# Patient Record
Sex: Female | Born: 1990 | Race: White | Hispanic: No | Marital: Single | Smoking: Never smoker
Health system: Southern US, Community
[De-identification: ages and names within clinical notes are randomized; demographics above are authoritative.]

## PROBLEM LIST (undated history)

## (undated) DIAGNOSIS — F909 Attention-deficit hyperactivity disorder, unspecified type: Secondary | ICD-10-CM

## (undated) DIAGNOSIS — T7840XA Allergy, unspecified, initial encounter: Secondary | ICD-10-CM

## (undated) HISTORY — DX: Attention-deficit hyperactivity disorder, unspecified type: F90.9

## (undated) HISTORY — DX: Allergy, unspecified, initial encounter: T78.40XA

---
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
## 2011-01-08 ENCOUNTER — Other Ambulatory Visit: Payer: Self-pay | Admitting: *Deleted

## 2011-01-08 MED ORDER — AMPHETAMINE-DEXTROAMPHET ER 30 MG PO CP24: 30.0000 mg | ORAL_CAPSULE | ORAL | Status: DC

## 2011-01-08 NOTE — Telephone Encounter (Signed)
Please call mother when ready.

## 2011-01-08 NOTE — Telephone Encounter (Signed)
Rx in your IN box for signature. 

## 2011-01-09 NOTE — Telephone Encounter (Signed)
Left message on mom's cell phone voicemail advising that Rx is ready for pick up will be left at front desk.

## 2011-02-21 ENCOUNTER — Other Ambulatory Visit: Payer: Self-pay | Admitting: Internal Medicine

## 2011-02-21 MED ORDER — AMPHETAMINE-DEXTROAMPHET ER 30 MG PO CP24: 30.0000 mg | ORAL_CAPSULE | ORAL | Status: DC

## 2011-02-21 NOTE — Telephone Encounter (Signed)
It should be in her chart.  Please enter formal refill request.

## 2011-02-21 NOTE — Telephone Encounter (Signed)
Refill on her Adderall XR 30mg .  It's not listed in her chart.  Please advise.

## 2011-02-21 NOTE — Telephone Encounter (Signed)
Left message on mom's cell phone voicemail, Rx is ready for pick up will be left at front desk.

## 2011-04-09 ENCOUNTER — Telehealth: Payer: Self-pay | Admitting: *Deleted

## 2011-04-09 MED ORDER — AMPHETAMINE-DEXTROAMPHET ER 30 MG PO CP24: 30.0000 mg | ORAL_CAPSULE | ORAL | Status: DC

## 2011-04-09 NOTE — Telephone Encounter (Signed)
Rx given to patients mom Corrie Dandy) during office visit today.

## 2011-05-28 ENCOUNTER — Other Ambulatory Visit: Payer: Self-pay

## 2011-05-28 MED ORDER — AMPHETAMINE-DEXTROAMPHET ER 30 MG PO CP24: 30.0000 mg | ORAL_CAPSULE | ORAL | Status: DC

## 2011-05-28 NOTE — Telephone Encounter (Signed)
Ok to print and put in my box to sign. 

## 2011-05-28 NOTE — Telephone Encounter (Signed)
Lucilla Lame request written rx for pts Adderall XR 30 mg. Pt last seen 10/15/09. Please call Lucilla Lame when rx ready for pick up at [phone].

## 2011-05-29 NOTE — Telephone Encounter (Signed)
Script placed up front for pick up, advised mother.

## 2011-07-10 ENCOUNTER — Other Ambulatory Visit: Payer: Self-pay

## 2011-07-10 NOTE — Telephone Encounter (Signed)
pts mother Avonelle Viveros left v/m requesting written rx for Adderall XR 30 mg. Pt last seen 10/15/09. When rx ready for pick up call [phone].

## 2011-07-11 MED ORDER — AMPHETAMINE-DEXTROAMPHET ER 30 MG PO CP24: 30.0000 mg | ORAL_CAPSULE | ORAL | Status: DC

## 2011-07-11 NOTE — Telephone Encounter (Signed)
Left message on pt's voice mail advising her scripts are ready for pick up, script placed up front.

## 2011-08-21 ENCOUNTER — Ambulatory Visit (INDEPENDENT_AMBULATORY_CARE_PROVIDER_SITE_OTHER): Payer: BC Managed Care – PPO | Admitting: Family Medicine

## 2011-08-21 ENCOUNTER — Encounter: Payer: Self-pay | Admitting: Family Medicine

## 2011-08-21 ENCOUNTER — Ambulatory Visit: Payer: Self-pay | Admitting: Family Medicine

## 2011-08-21 ENCOUNTER — Ambulatory Visit (INDEPENDENT_AMBULATORY_CARE_PROVIDER_SITE_OTHER)
Admission: RE | Admit: 2011-08-21 | Discharge: 2011-08-21 | Disposition: A | Discharge status: Home / Self Care | Payer: BC Managed Care – PPO | Source: Ambulatory Visit | Attending: Family Medicine | Admitting: Family Medicine

## 2011-08-21 VITALS — BP 110/60 | HR 78 | Temp 97.7°F | Ht 65.5 in | Wt 168.8 lb

## 2011-08-21 DIAGNOSIS — S93429A Sprain of deltoid ligament of unspecified ankle, initial encounter: Secondary | ICD-10-CM

## 2011-08-21 DIAGNOSIS — M79673 Pain in unspecified foot: Secondary | ICD-10-CM

## 2011-08-21 DIAGNOSIS — M79609 Pain in unspecified limb: Secondary | ICD-10-CM

## 2011-08-21 DIAGNOSIS — M25579 Pain in unspecified ankle and joints of unspecified foot: Secondary | ICD-10-CM

## 2011-08-21 LAB — POCT URINE PREGNANCY: Preg Test, Ur: NEGATIVE

## 2011-08-22 NOTE — Progress Notes (Signed)
Nature conservation officer at [facility] [address] [place] [address] Phone: [phone] Fax: [phone]   Patient Name: Norma Taylor Date of Birth: 1991/01/08 Age: 21 y.o. Medical Record Number: [id number] Gender: female Date of Encounter: 08/21/2011  Chief Complaint: Ankle Injury   History of Present Illness:  Norma Taylor is a 21 y.o. very pleasant female patient who presents with the following:  Very pleasant patient who presents for evaluation of her RIGHT foot and ankle after injuring it several days ago. One of her friends was having a seizure, and in some way she injured her foot and ankle. She is not clear if she turned it. She is not clear if she struck it directly. She is having the most pain and her calcaneus. She is also having some mild medial pain. No lateral plane. No pain in the region of the calcaneofibular ligament or the ATFL. No pain along the tibia or fibula. She is having affectively no swelling at this point. No bruising. She has been able to walk throughout the time, and has been able to walk since the time of injury.  Past Medical History, Surgical History, Social History, Family History, Problem List, Medications, and Allergies have been reviewed and updated if relevant.  Current Outpatient Prescriptions on File Prior to Visit  Medication Sig Dispense Refill  . amphetamine-dextroamphetamine (ADDERALL XR) 30 MG 24 hr capsule Take 1 capsule (30 mg total) by mouth every morning.  30 capsule  0  . amphetamine-dextroamphetamine (ADDERALL XR, 30MG ,) 30 MG 24 hr capsule Take 1 capsule (30 mg total) by mouth every morning.  30 capsule  0  . amphetamine-dextroamphetamine (ADDERALL XR, 30MG ,) 30 MG 24 hr capsule Take 1 capsule (30 mg total) by mouth every morning.  30 capsule  0  . amphetamine-dextroamphetamine (ADDERALL XR, 30MG ,) 30 MG 24 hr capsule Take 1 capsule (30 mg total) by mouth every morning.  30 capsule  0  . amphetamine-dextroamphetamine  (ADDERALL XR, 30MG ,) 30 MG 24 hr capsule Take 1 capsule (30 mg total) by mouth every morning.  30 capsule  0  . amphetamine-dextroamphetamine (ADDERALL XR, 30MG ,) 30 MG 24 hr capsule Take 1 capsule (30 mg total) by mouth every morning.  30 capsule  0    Review of Systems:  GEN: No fevers, chills. Nontoxic. Primarily MSK c/o today. MSK: Detailed in the HPI GI: tolerating PO intake without difficulty Neuro: No numbness, parasthesias, or tingling associated. Otherwise the pertinent positives of the ROS are noted above.    Physical Examination: Filed Vitals:   08/21/11 1008  BP: 110/60  Pulse: 78  Temp: 97.7 F (36.5 C)   Filed Vitals:   08/21/11 1008  Height: 5' 5.5" (1.664 m)  Weight: 168 lb 12 oz (76.544 kg)   Body mass index is 27.65 kg/(m^2). Ideal Body Weight: Weight in (lb) to have BMI = 25: 152.2    GEN: Well-developed,well-nourished,in no acute distress; alert,appropriate and cooperative throughout examination HEENT: Normocephalic and atraumatic without obvious abnormalities. Ears, externally no deformities PULM: Breathing comfortably in no respiratory distress EXT: No clubbing, cyanosis, or edema PSYCH: Normally interactive. Cooperative during the interview. Pleasant. Friendly and conversant. Not anxious or depressed appearing. Normal, full affect.  ANKLE: R Echymosis: no Edema: no ROM: Full dorsi and plantar flexion, inversion, eversion Gait: heel toe, non-antalgic Lateral Mall: NT Medial Mall: NT Talus: NT Navicular: NT Cuboid: NT Calcaneous: mildly tender with squeeze Metatarsals: NT 5th MT: NT Phalanges: NT Achilles: NT Plantar Fascia: NT Fat Pad:  NT Peroneals: NT Post Tib: NT Great Toe: Nml motion Ant Drawer: neg Talar Tilt: neg ATFL: NT CFL: NT Deltoid: mildly tender Str: 5/5 Other Special tests: kleiger neg, squeeze neg Sensation: intact   EKG / Xrays / Labs: Dg Ankle Complete Right  08/21/2011  *RADIOLOGY REPORT*  Clinical Data: Trauma,  ankle pain.  RIGHT ANKLE - COMPLETE 3+ VIEW  Comparison: None.  Findings: There is abnormal appearance of the ankle mortise with widening of the lateral mortise on both the AP and oblique views. Lateral soft tissue swelling.  I see no definite fracture.  IMPRESSION: Lateral ankle mortise widening without fracture.  Findings suggestive of ligamentous injury and instability.  Original Report Authenticated By: Cyndie Chime, M.D.  --- on my interpretation, there is no occult fracture. The mortise view is overrotated, so a true mortise view is not seen. The patient is not having any lateral ankle pain and is stable on her subtalar tilt test. Kleiger test and squeeze testing is all negative.  Dg Os Calcis Right  08/21/2011  *RADIOLOGY REPORT*  Clinical Data: Ankle pain.  Foot pain.  Trauma.  RIGHT OS CALCIS - 2+ VIEW  Comparison: None.  Findings: Anatomic alignment.  Calcaneus appears intact.  Subtalar joints appear congruent.  No fracture is identified.  IMPRESSION: Negative radiographs of the calcaneus.  Original Report Authenticated By: Andreas Newport, M.D.    Assessment and Plan: 1. Ankle pain  DG Ankle Complete Right, DG Os Calcis Right, POCT urine pregnancy  2. Foot pain  DG Ankle Complete Right, DG Os Calcis Right, POCT urine pregnancy  3. Deltoid (ligament), ankle sprain      Results for orders placed in visit on 08/21/11  POCT URINE PREGNANCY      Component Value Range   Preg Test, Ur Negative     Clinically consistent with mild deltoid ligament sprain on the RIGHT. Also suspected bone contusion is playing  The greatest role with her heel pain. Her strength is preserved.  I'm going to place her in his ASO ankle brace for the next week, and then titrate out of this. Reassured her and patient and her mother.  Over-the-counter Motrin or Tylenol for pain  Norma Beat, MD

## 2011-09-10 ENCOUNTER — Other Ambulatory Visit: Payer: Self-pay

## 2011-09-10 MED ORDER — AMPHETAMINE-DEXTROAMPHET ER 30 MG PO CP24: 30.0000 mg | ORAL_CAPSULE | ORAL | Status: DC

## 2011-09-10 NOTE — Telephone Encounter (Signed)
pts mother request rx adderall XR. Call when ready for pick up.

## 2011-09-10 NOTE — Telephone Encounter (Signed)
Advised mother script is ready for pick up, script placed up front.

## 2011-10-15 ENCOUNTER — Other Ambulatory Visit: Payer: Self-pay | Admitting: Family Medicine

## 2011-10-15 MED ORDER — AMPHETAMINE-DEXTROAMPHET ER 30 MG PO CP24: 30.0000 mg | ORAL_CAPSULE | ORAL | Status: DC

## 2011-10-15 NOTE — Telephone Encounter (Signed)
Advised pt Dr. Dayton Martes is out of the office until Monday. She is ok to wait till then for the script.  Script printed and placed in Dr. Elmer Sow in box.

## 2011-10-15 NOTE — Telephone Encounter (Signed)
Needs a script for Adderall XR 30mg  po daily.   Please let mom know when this is ready to be picked up.

## 2011-10-20 NOTE — Telephone Encounter (Signed)
Advised patient's mother script is ready for pick up, script placed at front desk.

## 2011-11-24 ENCOUNTER — Telehealth: Payer: Self-pay | Admitting: Family Medicine

## 2011-11-24 NOTE — Telephone Encounter (Signed)
The pt's mother called the triage line hoping to get a refill of Adderall for the patient.  She stated she is hoping to change the dose to 20mg .  Callback - [phone]

## 2011-11-24 NOTE — Telephone Encounter (Signed)
Pt has been taking 30 mg's but doesn't need think that she needs that high of a dose anymore, she isn't taking as many classes now.  Wants to decrease to 20 mg's.  Please advise.

## 2011-11-25 MED ORDER — AMPHETAMINE-DEXTROAMPHET ER 20 MG PO CP24: 20.0000 mg | ORAL_CAPSULE | ORAL | Status: DC

## 2011-11-25 NOTE — Telephone Encounter (Signed)
Rx signed, on my desk.

## 2011-11-25 NOTE — Telephone Encounter (Signed)
Advised mother script is ready for pick up, script placed at front desk.

## 2011-11-25 NOTE — Telephone Encounter (Signed)
Script printed, on your desk for signature.

## 2011-11-25 NOTE — Telephone Encounter (Signed)
Ok to decrease to 20 mg.

## 2011-12-22 ENCOUNTER — Other Ambulatory Visit: Payer: Self-pay

## 2011-12-22 NOTE — Telephone Encounter (Signed)
pts mother request rx for Adderall XR 20 mg. Corrie Dandy said pt only taking XR 20 mg now.call when ready for pick up.

## 2011-12-23 MED ORDER — AMPHETAMINE-DEXTROAMPHET ER 20 MG PO CP24: 20.0000 mg | ORAL_CAPSULE | ORAL | Status: DC

## 2011-12-23 NOTE — Telephone Encounter (Signed)
Left message advising pt's mother script is ready for pick up.

## 2012-03-01 ENCOUNTER — Other Ambulatory Visit: Payer: Self-pay

## 2012-03-01 MED ORDER — AMPHETAMINE-DEXTROAMPHET ER 20 MG PO CP24: 20.0000 mg | ORAL_CAPSULE | ORAL | Status: DC

## 2012-03-01 NOTE — Telephone Encounter (Signed)
pts mother left v/m requesting rx adderall XR 20 mg.call when ready for pick up.

## 2012-03-01 NOTE — Telephone Encounter (Signed)
Left message on home voice mail that script is ready for pick up.

## 2012-03-04 ENCOUNTER — Telehealth: Payer: Self-pay

## 2012-03-04 NOTE — Telephone Encounter (Signed)
pts mother left v/m that insurance has changed and needs prior auth for adderall. Pharmacy is sending PA request but pts mother request to be done ASAP.Please advise.

## 2012-03-09 NOTE — Telephone Encounter (Signed)
pts mother said pt is out of med and wants status of PA for Adderall. Jacki Cones said has not received PA from pharmacy/ Pts mother will contact pharmacy to fax PA request.

## 2012-03-11 NOTE — Telephone Encounter (Signed)
Advised mother that we still haven't received prior auth form from pharmacy.  She said she will call pharmacy.

## 2012-03-11 NOTE — Telephone Encounter (Signed)
Caller: Mary/Mother; Phone: [phone]; Reason for Call: Mom calling today 03/11/12 regarding checking status of pre-authorization form that was supposed to be completed for her daughter's Adderall.  Said pharmacist, Kathlene November, from [facility] faxed the form for a second time on 03/09/12 however they still have not received this back from office.  PLEASE CALL MOM BACK AT [phone] TO ADVISE.  Thanks.

## 2012-03-15 ENCOUNTER — Telehealth: Payer: Self-pay | Admitting: *Deleted

## 2012-03-15 NOTE — Telephone Encounter (Signed)
Prior auth given for adderall over the phone, good for one year, advised patient's mother.

## 2012-03-16 NOTE — Telephone Encounter (Signed)
Prior auth given over the phone for adderall, pt's mother advised.  Approval letter placed on doctor's desk for signature and scanning.

## 2012-05-28 ENCOUNTER — Telehealth: Payer: Self-pay | Admitting: *Deleted

## 2012-05-28 MED ORDER — AMPHETAMINE-DEXTROAMPHET ER 30 MG PO CP24: 30.0000 mg | ORAL_CAPSULE | ORAL | Status: DC

## 2012-05-28 MED ORDER — AMPHETAMINE-DEXTROAMPHET ER 20 MG PO CP24: 20.0000 mg | ORAL_CAPSULE | ORAL | Status: DC

## 2012-05-28 NOTE — Telephone Encounter (Signed)
Mother is here seeing doctor and would like to pick up refills on adderall.

## 2012-05-28 NOTE — Telephone Encounter (Signed)
Rx refilled and given to mother.

## 2012-07-21 ENCOUNTER — Other Ambulatory Visit: Payer: Self-pay

## 2012-07-21 NOTE — Telephone Encounter (Signed)
Norma Taylor left v/m requesting rx adderall 20 mg. Call when ready for pick up.

## 2012-07-22 MED ORDER — AMPHETAMINE-DEXTROAMPHET ER 20 MG PO CP24: 20.0000 mg | ORAL_CAPSULE | ORAL | Status: DC

## 2012-07-22 NOTE — Telephone Encounter (Signed)
Left message on mother's voice mail advising script is ready for pick up, and because we have a new policy here in office, patient should pick up prescription herself.

## 2012-07-26 ENCOUNTER — Encounter: Payer: Self-pay | Admitting: Family Medicine

## 2012-08-09 ENCOUNTER — Encounter: Payer: Self-pay | Admitting: Family Medicine

## 2012-08-24 ENCOUNTER — Other Ambulatory Visit: Payer: Self-pay

## 2012-08-24 NOTE — Telephone Encounter (Signed)
Ok to print out and leave on my desk for signature. 

## 2012-08-24 NOTE — Telephone Encounter (Signed)
Norma Taylor left v/m requesting rx adderal XR 20 mg only. Call when ready for pick up.

## 2012-08-26 MED ORDER — AMPHETAMINE-DEXTROAMPHET ER 20 MG PO CP24: 20.0000 mg | ORAL_CAPSULE | ORAL | Status: DC

## 2012-09-09 MED ORDER — AMPHETAMINE-DEXTROAMPHET ER 20 MG PO CP24: 20.0000 mg | ORAL_CAPSULE | ORAL | Status: DC

## 2012-09-09 NOTE — Telephone Encounter (Signed)
This was done on 08/24/12.  Disregard script printed today.

## 2012-09-13 ENCOUNTER — Ambulatory Visit: Payer: BC Managed Care – PPO | Admitting: Family Medicine

## 2012-11-08 ENCOUNTER — Other Ambulatory Visit: Payer: Self-pay

## 2012-11-08 MED ORDER — AMPHETAMINE-DEXTROAMPHET ER 20 MG PO CP24: 20.0000 mg | ORAL_CAPSULE | ORAL | Status: DC

## 2012-11-08 NOTE — Telephone Encounter (Signed)
Norma Taylor pts mother request rx adderall xr. Call when ready for pick up.

## 2012-11-08 NOTE — Telephone Encounter (Signed)
Advised pt's mother scripts are ready for pick up, placed at front desk.

## 2012-12-20 ENCOUNTER — Other Ambulatory Visit: Payer: Self-pay

## 2012-12-20 NOTE — Telephone Encounter (Signed)
Norma Taylor request rx adderall. Call when ready for pick up.

## 2012-12-21 NOTE — Telephone Encounter (Signed)
Left message on machine to call back  

## 2012-12-21 NOTE — Telephone Encounter (Signed)
Patient's mom notified as instructed by telephone. Was advised that she will call back and schedule the appointment.

## 2012-12-21 NOTE — Telephone Encounter (Signed)
No refill denied.. Pt needs appt for ADHD or CPX if she has this here.

## 2012-12-21 NOTE — Telephone Encounter (Signed)
Last office visit 08/21/11. Is it okay to refill medication?

## 2013-03-17 ENCOUNTER — Ambulatory Visit: Payer: Self-pay

## 2013-03-17 LAB — RAPID INFLUENZA A&B ANTIGENS

## 2013-03-25 ENCOUNTER — Ambulatory Visit: Payer: BC Managed Care – PPO | Admitting: Family Medicine

## 2013-03-25 DIAGNOSIS — Z0289 Encounter for other administrative examinations: Secondary | ICD-10-CM

## 2013-05-05 ENCOUNTER — Ambulatory Visit: Payer: BC Managed Care – PPO | Admitting: Family Medicine

## 2013-06-23 ENCOUNTER — Ambulatory Visit: Payer: BC Managed Care – PPO | Admitting: Family Medicine

## 2013-07-04 ENCOUNTER — Ambulatory Visit: Payer: BC Managed Care – PPO | Admitting: Family Medicine

## 2013-07-15 IMAGING — CR DG OS CALCIS 2+V*R*
2 series · 2 of 2 positions shown · non-contrast
Comparison: None.

CLINICAL DATA: Ankle pain.  Foot pain.  Trauma.

RIGHT OS CALCIS - 2+ VIEW

[view not recorded (1 of 2)]
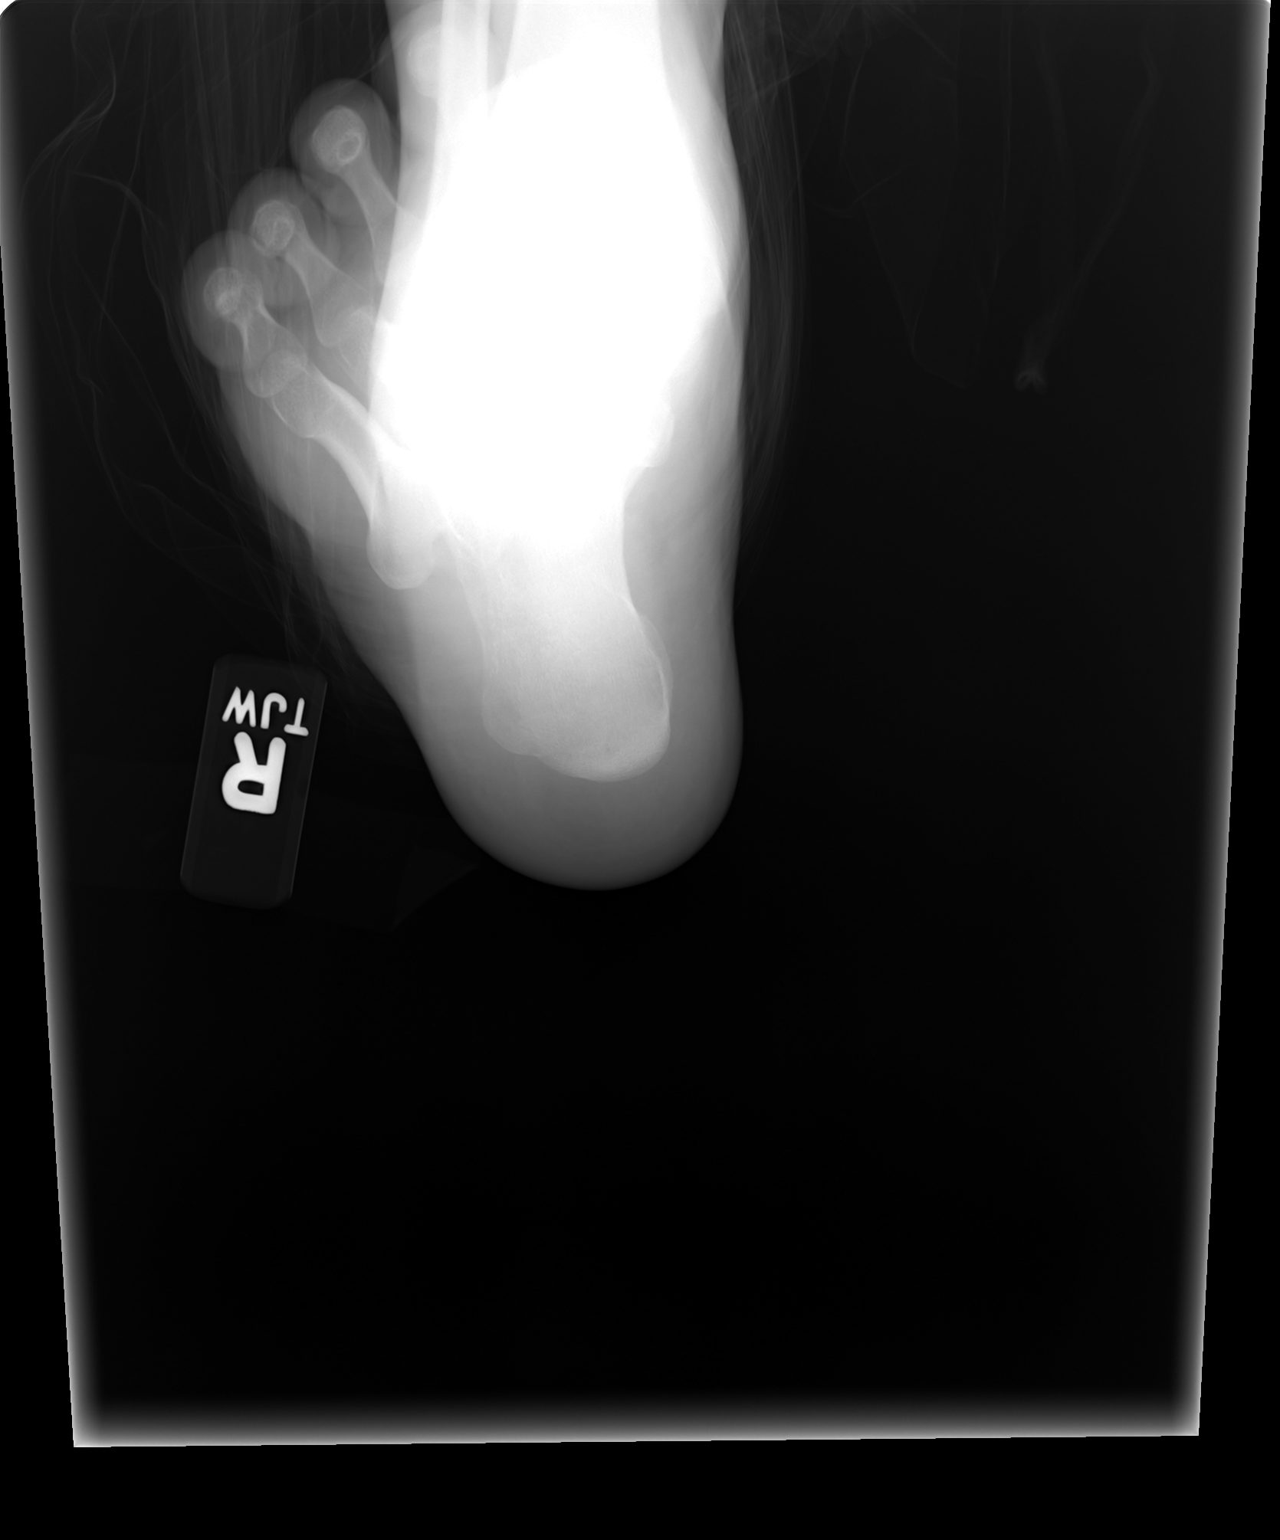

[view not recorded (2 of 2)]
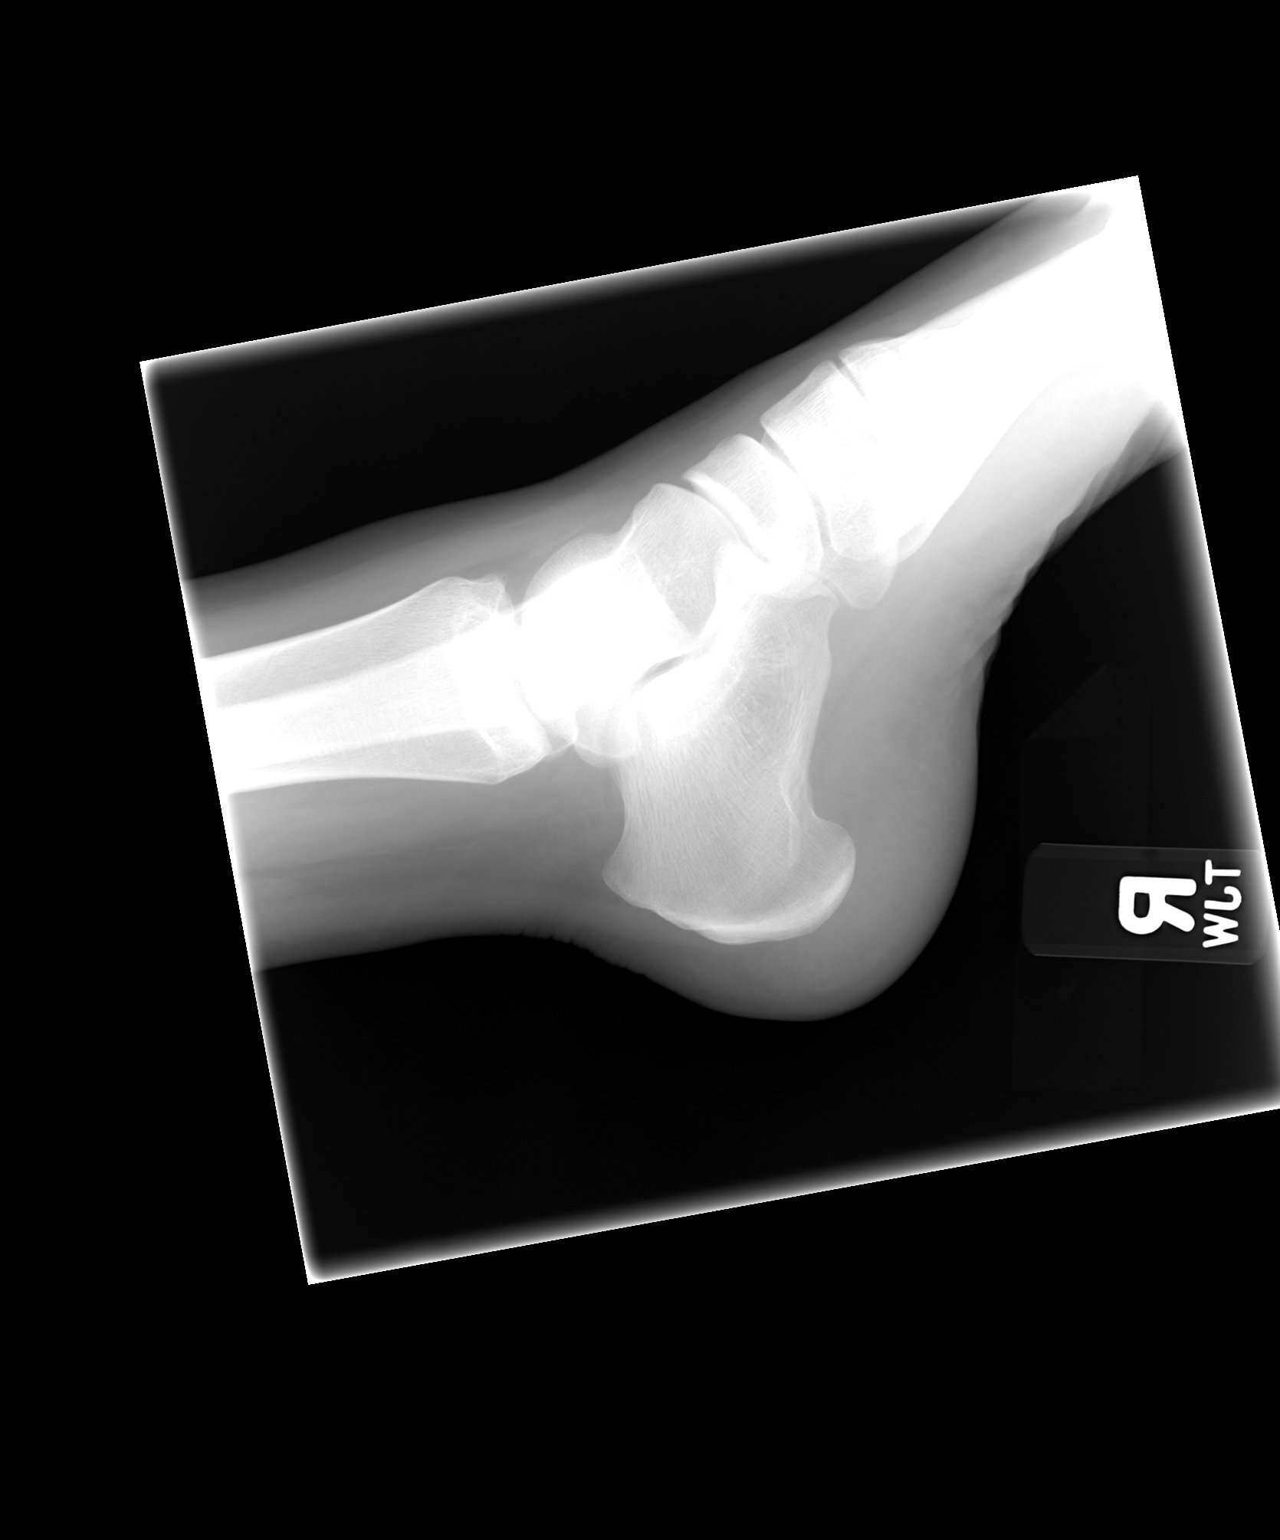

[2 of 2 positions shown; findings below may reference images not displayed]

FINDINGS: Anatomic alignment.  Calcaneus appears intact.  Subtalar
joints appear congruent.  No fracture is identified.
IMPRESSION: Negative radiographs of the calcaneus.

## 2013-07-15 IMAGING — CR DG ANKLE COMPLETE 3+V*R*
3 series · 3 of 3 positions shown · non-contrast
Comparison: None.

CLINICAL DATA: Trauma, ankle pain.

RIGHT ANKLE - COMPLETE 3+ VIEW

[view not recorded (1 of 3)]
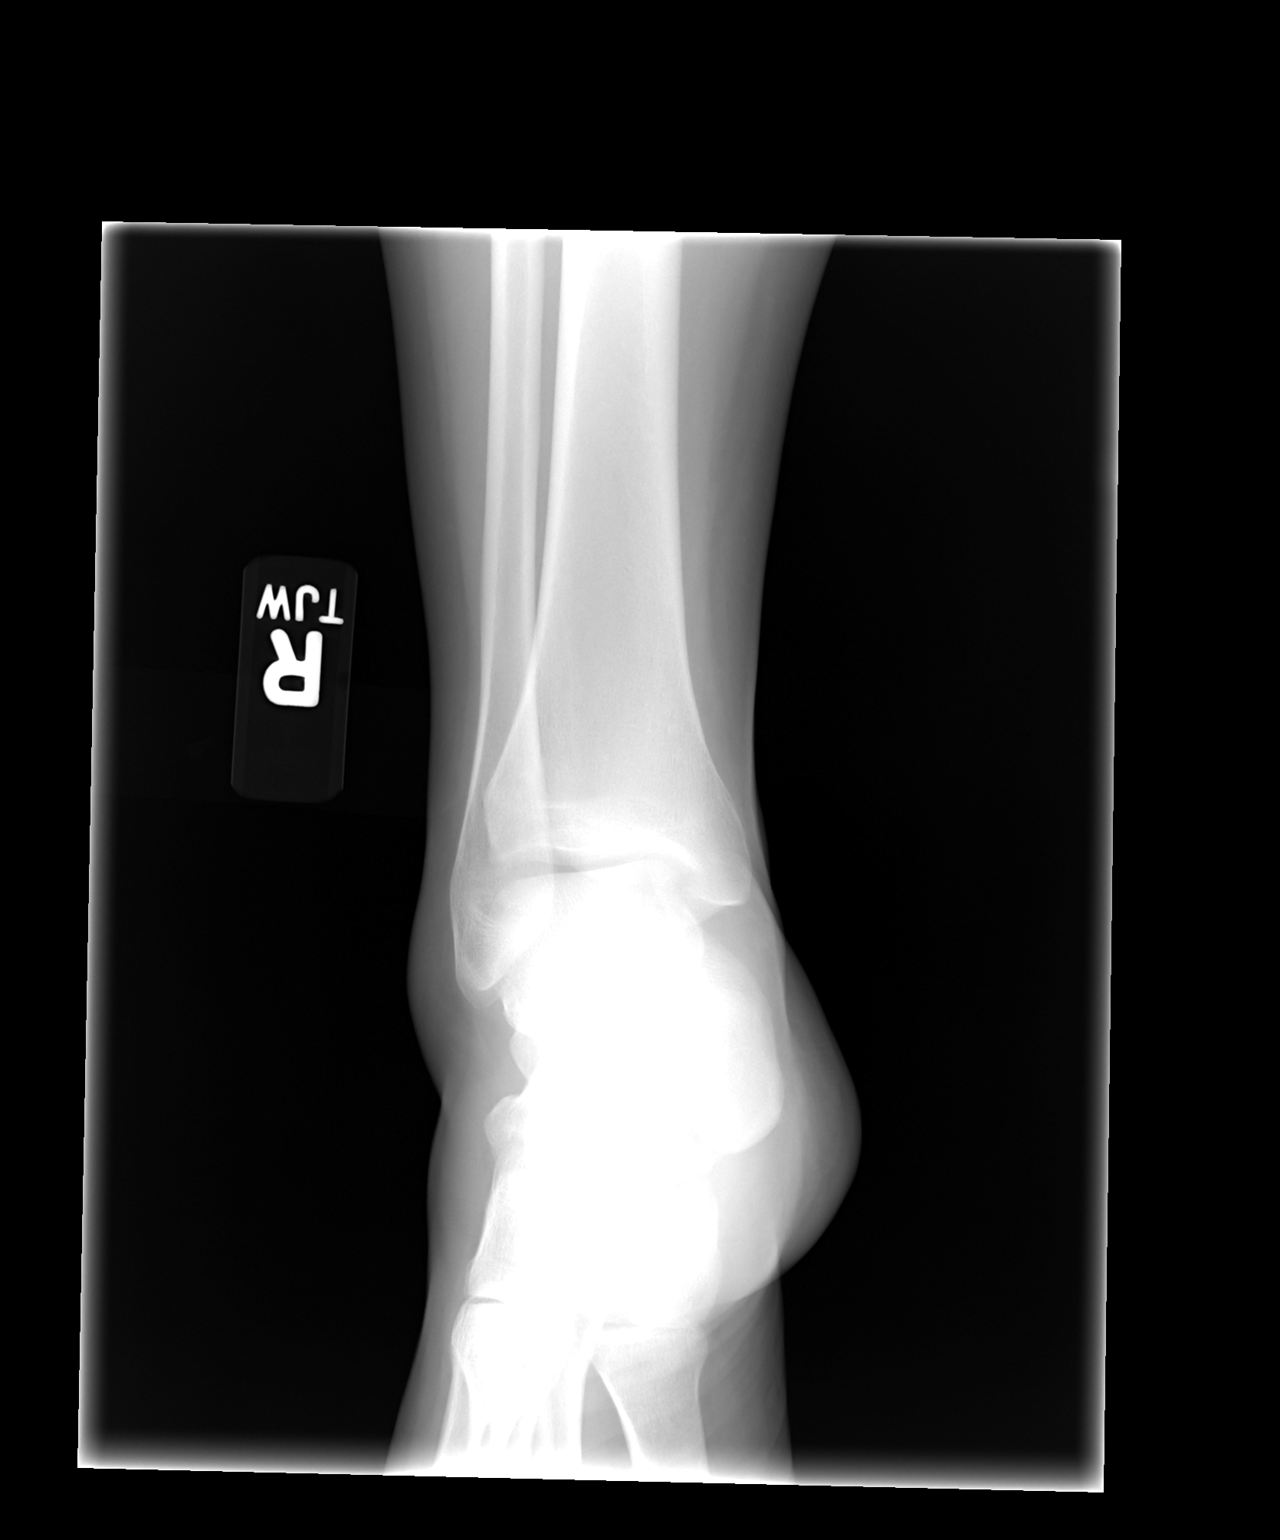

[view not recorded (2 of 3)]
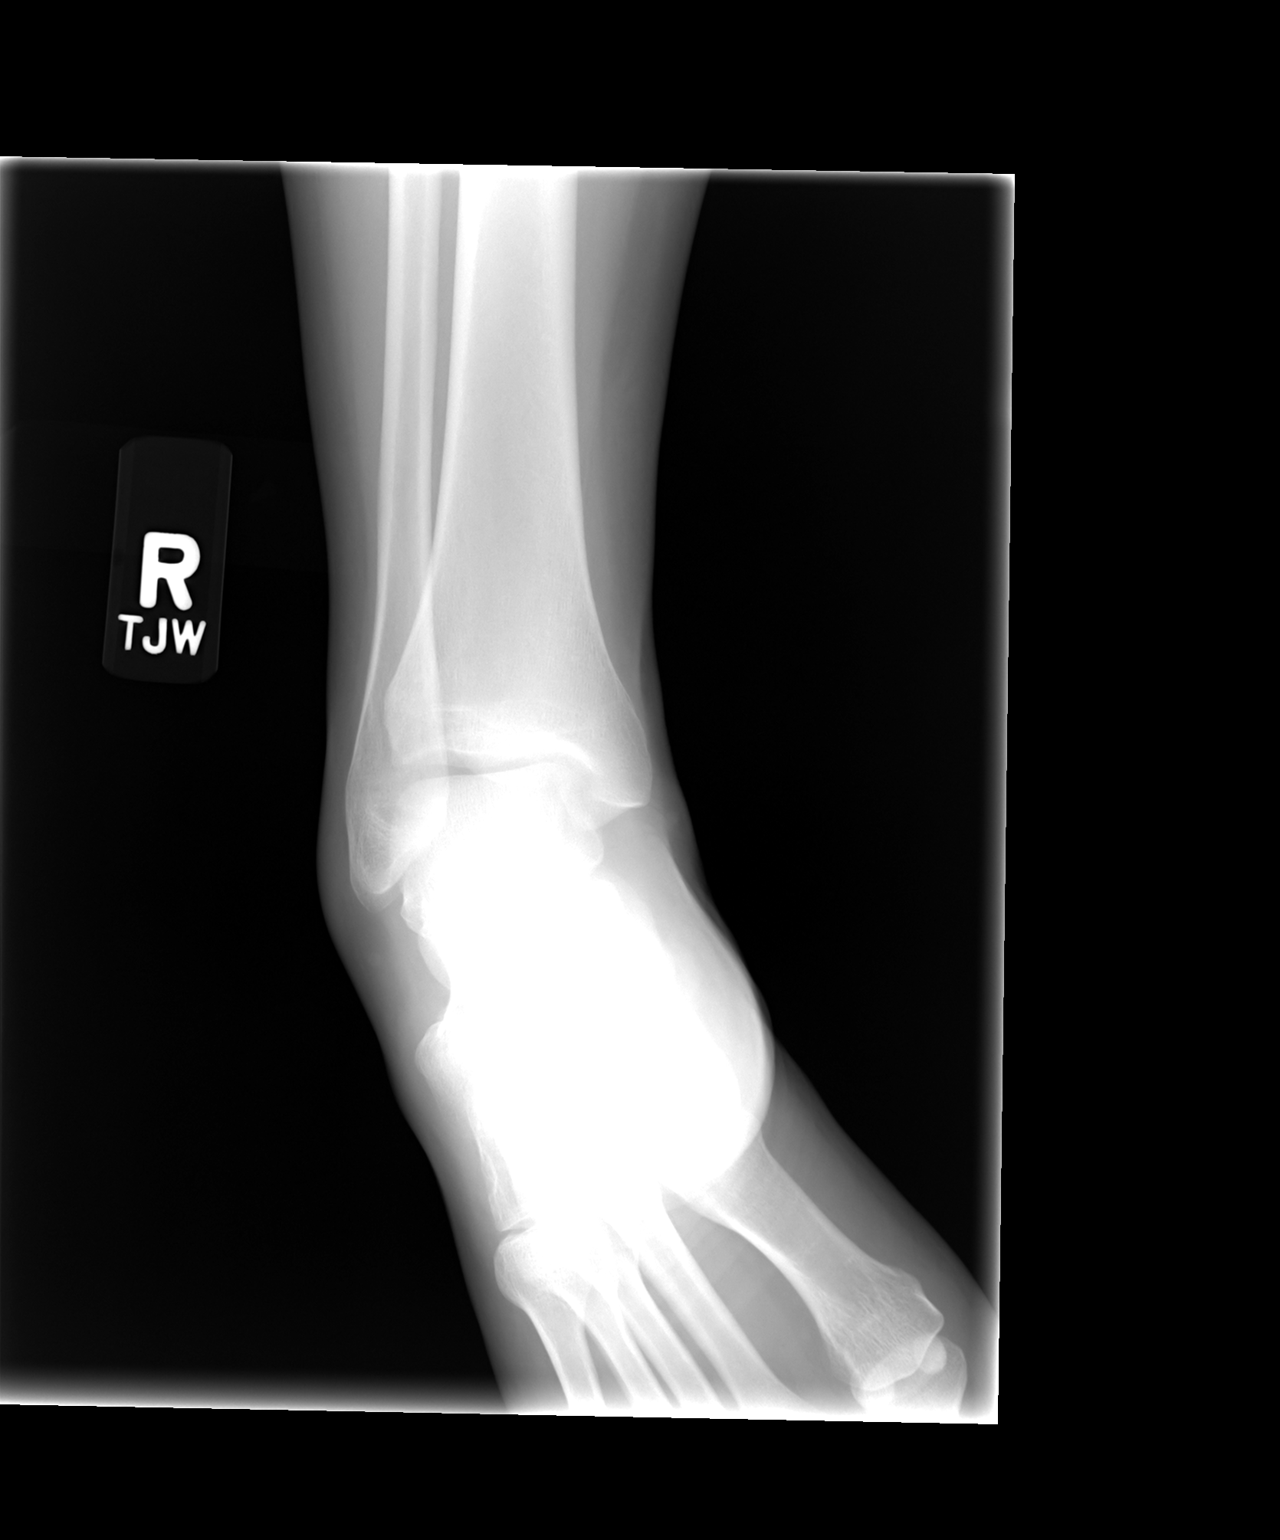

[view not recorded (3 of 3)]
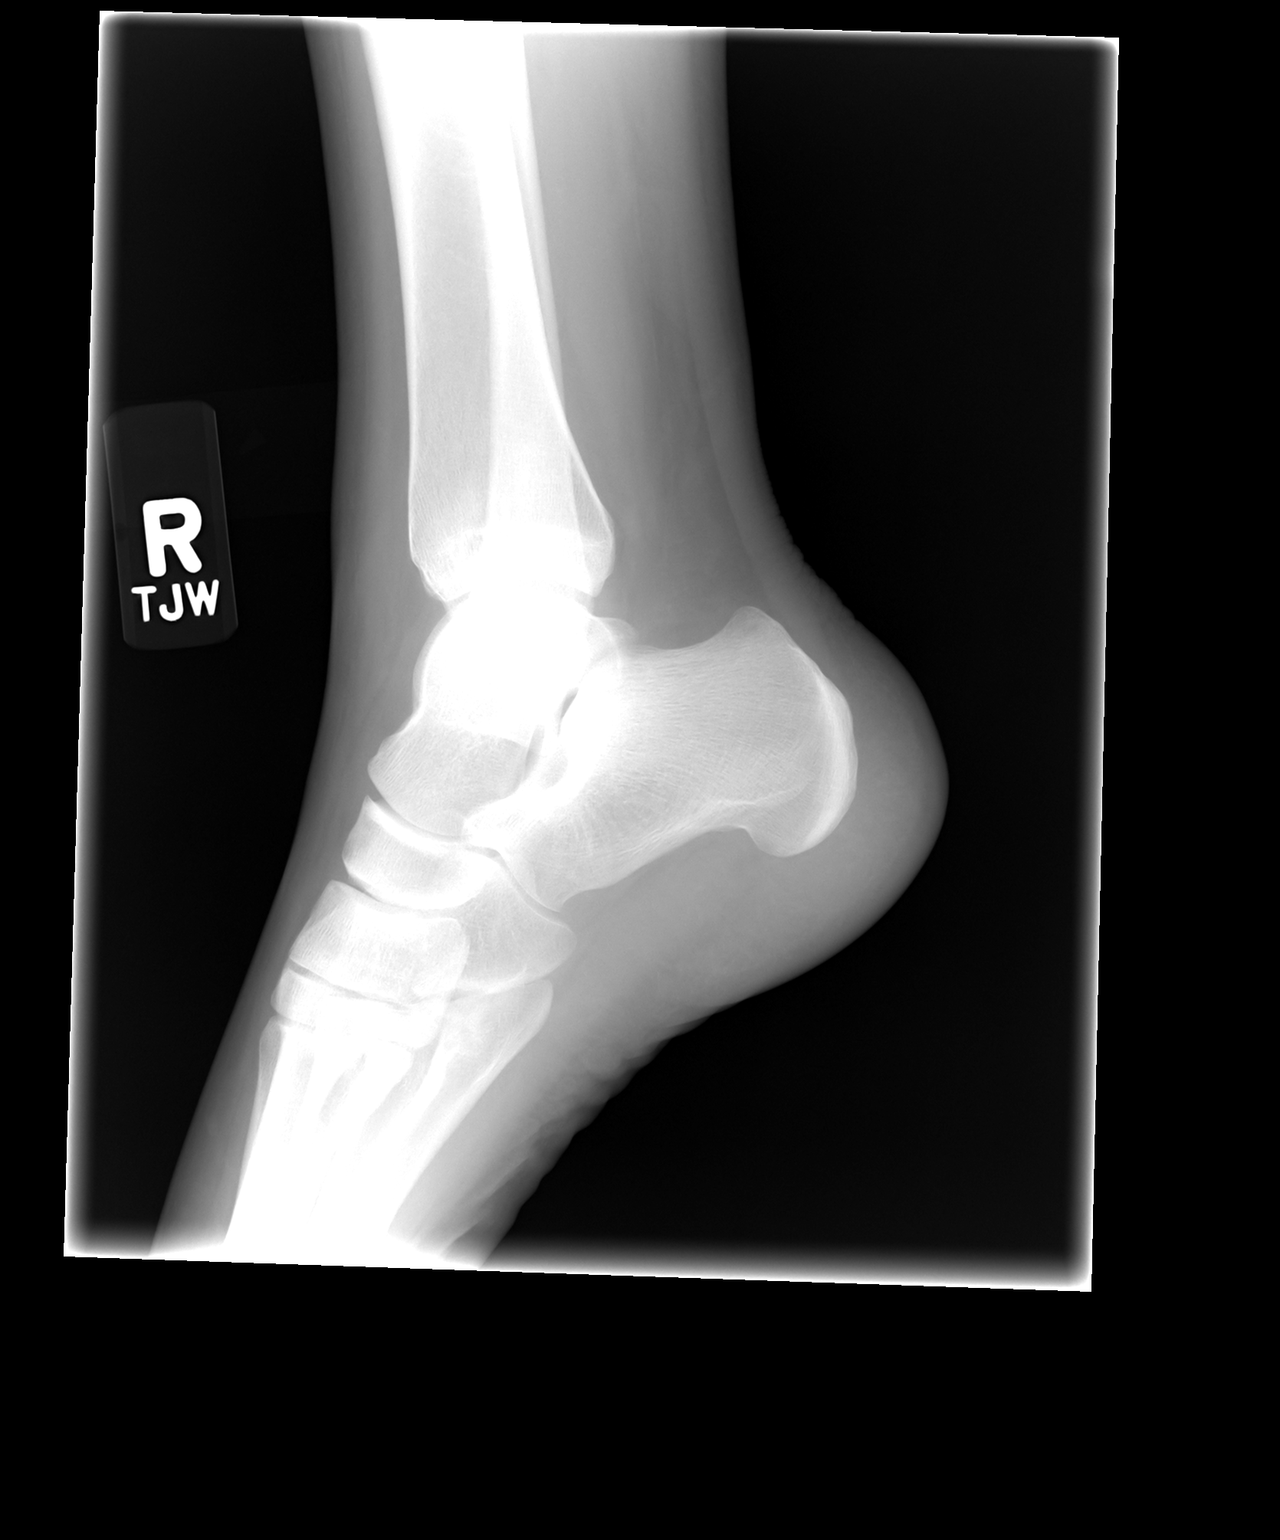

[3 of 3 positions shown; findings below may reference images not displayed]

FINDINGS: There is abnormal appearance of the ankle mortise with
widening of the lateral mortise on both the AP and oblique views.
Lateral soft tissue swelling.  I see no definite fracture.
IMPRESSION: Lateral ankle mortise widening without fracture.  Findings
suggestive of ligamentous injury and instability.

## 2013-11-09 ENCOUNTER — Ambulatory Visit (INDEPENDENT_AMBULATORY_CARE_PROVIDER_SITE_OTHER): Payer: BC Managed Care – PPO | Admitting: Family Medicine

## 2013-11-09 ENCOUNTER — Encounter: Payer: Self-pay | Admitting: Family Medicine

## 2013-11-09 VITALS — BP 128/70 | HR 84 | Temp 98.0°F | Wt 191.0 lb

## 2013-11-09 DIAGNOSIS — K5289 Other specified noninfective gastroenteritis and colitis: Secondary | ICD-10-CM

## 2013-11-09 DIAGNOSIS — K529 Noninfective gastroenteritis and colitis, unspecified: Secondary | ICD-10-CM

## 2013-11-09 MED ORDER — ONDANSETRON HCL 4 MG PO TABS: 4.0000 mg | ORAL_TABLET | Freq: Three times a day (TID) | ORAL | Status: DC | PRN

## 2013-11-09 NOTE — Patient Instructions (Signed)

## 2013-11-09 NOTE — Progress Notes (Signed)
(  S) Norma Taylor is a 23 y.o. female with complaint of gastrointestinal symptoms of fevers, nausea, vomiting for 4 days. Tmax 100.2 two days ago.  No diarrhea.  No blood in stool. Last vomited this morning.  No current outpatient prescriptions on file prior to visit.   No current facility-administered medications on file prior to visit.    No Known Allergies  Past Medical History  Diagnosis Date  . Allergy   . ADHD (attention deficit hyperactivity disorder)     No past surgical history on file.  Family History  Problem Relation Age of Onset  . Cancer Maternal Grandmother     cervical    History   Social History  . Marital Status: Single    Spouse Name: N/A    Number of Children: N/A  . Years of Education: N/A   Occupational History  . Not on file.   Social History Main Topics  . Smoking status: Never Smoker   . Smokeless tobacco: Not on file  . Alcohol Use: No  . Drug Use: No  . Sexual Activity: Not on file   Other Topics Concern  . Not on file   Social History Narrative   Wants to join marines   Not sexual active, good relationship with mother   The PMH, PSH, Social History, Family History, Medications, and allergies have been reviewed in [facility], and have been updated if relevant.   (O) BP 128/70  Pulse 84  Temp(Src) 98 F (36.7 C) (Oral)  Wt 191 lb (86.637 kg)  SpO2 97%  LMP 10/22/2013   Physical exam reveals the patient appears well. Hydration status: well hydrated. Abdomen: abdomen is soft without significant tenderness, masses, organomegaly or guarding..  (A) Viral Gastroenteritis  (P) I have recommended small amounts clear fluids frequently, soups, juices, water and advance diet as tolerated. Return office visit if symptoms persist or worsen; I have alerted the patient to call if high fever, dehydration, marked weakness, fainting, increased abdominal pain, blood in stool or vomit.

## 2013-11-09 NOTE — Progress Notes (Signed)
Pre visit review using our clinic review tool, if applicable. No additional management support is needed unless otherwise documented below in the visit note. 

## 2014-02-14 ENCOUNTER — Ambulatory Visit (INDEPENDENT_AMBULATORY_CARE_PROVIDER_SITE_OTHER): Payer: BC Managed Care – PPO | Admitting: Family Medicine

## 2014-02-14 ENCOUNTER — Encounter: Payer: Self-pay | Admitting: Family Medicine

## 2014-02-14 VITALS — BP 128/72 | HR 65 | Temp 98.3°F | Wt 196.2 lb

## 2014-02-14 DIAGNOSIS — N926 Irregular menstruation, unspecified: Secondary | ICD-10-CM

## 2014-02-14 DIAGNOSIS — Z309 Encounter for contraceptive management, unspecified: Secondary | ICD-10-CM | POA: Insufficient documentation

## 2014-02-14 DIAGNOSIS — F909 Attention-deficit hyperactivity disorder, unspecified type: Secondary | ICD-10-CM

## 2014-02-14 DIAGNOSIS — Z23 Encounter for immunization: Secondary | ICD-10-CM

## 2014-02-14 DIAGNOSIS — Z30011 Encounter for initial prescription of contraceptive pills: Secondary | ICD-10-CM

## 2014-02-14 MED ORDER — AMPHETAMINE-DEXTROAMPHET ER 20 MG PO CP24: 20.0000 mg | ORAL_CAPSULE | ORAL | Status: DC

## 2014-02-14 MED ORDER — NORETHIN ACE-ETH ESTRAD-FE 1-20 MG-MCG PO TABS: 1.0000 | ORAL_TABLET | Freq: Every day | ORAL | Status: DC

## 2014-02-14 NOTE — Assessment & Plan Note (Signed)
>  25 minutes spent in face to face time with patient, >50% spent in counselling or coordination of care Discussed tx options.  After our discussion, she does want to try adderall XR 20 mg daily and call me with a follow up in a couple of months. Rx printed and given to pt.

## 2014-02-14 NOTE — Progress Notes (Signed)
Pre visit review using our clinic review tool, if applicable. No additional management support is needed unless otherwise documented below in the visit note. 

## 2014-02-14 NOTE — Assessment & Plan Note (Signed)
Education given regarding options for contraception, including barrier methods, injectable contraception, IUD placement, oral contraceptives. She would like to restart OCPs- eRx sent for loestrin.  Advised to continue to use condoms with every sexual encounter.  Also advised she get a pap smear and STD testing.  She declines doing this today but will schedule this after the holidays.

## 2014-02-14 NOTE — Progress Notes (Signed)
   Subjective:   Patient ID: Norma Taylor, female    DOB: 02-Sep-1990, 23 y.o.   MRN: [id number]  Norma Fabianlizabeth J Bill is a pleasant 23 y.o. year old female who presents to clinic today with Contraception and ADHD  on 02/14/2014  HPI:  Contraception management- G0, sexually active with her boyfriend of 4 years.  Does use condoms with every sexual encounter but would like to restart OCPs as well- was previously on OCP for menorrhagia.  Periods are still heavy and irregular.  S/p gardasil series (complete).  Has never had a pap smear.  ADHD- diagnosed in 3rd grade- formal psych eval. Has been over a year since she took adderall.  Going back to school, would like to restart it.  She is having a harder time concentrating. Denies feeling depressed or anxious.  No current outpatient prescriptions on file prior to visit.   No current facility-administered medications on file prior to visit.    No Known Allergies  Past Medical History  Diagnosis Date  . Allergy   . ADHD (attention deficit hyperactivity disorder)     History reviewed. No pertinent past surgical history.  Family History  Problem Relation Age of Onset  . Cancer Maternal Grandmother     cervical    History   Social History  . Marital Status: Single    Spouse Name: N/A    Number of Children: N/A  . Years of Education: N/A   Occupational History  . Not on file.   Social History Main Topics  . Smoking status: Never Smoker   . Smokeless tobacco: Not on file  . Alcohol Use: No  . Drug Use: No  . Sexual Activity: Not on file   Other Topics Concern  . Not on file   Social History Narrative   Wants to join marines   Not sexual active, good relationship with mother   The PMH, PSH, Social History, Family History, Medications, and allergies have been reviewed in [facility], and have been updated if relevant.   Review of Systems  Constitutional: Negative.   HENT: Negative.   Respiratory: Negative.     Cardiovascular: Negative.   Gastrointestinal: Negative.   Endocrine: Negative.   Genitourinary: Positive for menstrual problem. Negative for dysuria and difficulty urinating.  Musculoskeletal: Negative.   All other systems reviewed and are negative.      Objective:    BP 128/72 mmHg  Pulse 65  Temp(Src) 98.3 F (36.8 C) (Oral)  Wt 196 lb 4 oz (89.018 kg)  SpO2 98%   Physical Exam  Constitutional: She is oriented to person, place, and time. She appears well-developed and well-nourished. No distress.  HENT:  Head: Normocephalic and atraumatic.  Eyes: Conjunctivae are normal.  Neck: Normal range of motion.  Cardiovascular: Normal rate.   Pulmonary/Chest: Effort normal.  Musculoskeletal: She exhibits no edema.  Neurological: She is alert and oriented to person, place, and time.  Skin: Skin is warm and dry.  Psychiatric: She has a normal mood and affect. Her behavior is normal. Judgment and thought content normal.          Assessment & Plan:   Need for influenza vaccination - Plan: Flu Vaccine QUAD 36+ mos PF IM (Fluarix Quad PF)  Irregular menstrual cycle  Attention deficit hyperactivity disorder (ADHD), unspecified ADHD type No Follow-up on file.

## 2014-02-14 NOTE — Patient Instructions (Signed)
Great to see you. Happy Holidays!  Please call me in a few months with an update.

## 2014-09-25 ENCOUNTER — Telehealth: Payer: Self-pay

## 2014-09-25 NOTE — Telephone Encounter (Signed)
Pt left note requesting BC pill sent to [facility]; spoke with [facility]ist at [facility] [address] and has one year refill BC on hold; pt will contact [facility] and have rx transferred.

## 2014-12-04 ENCOUNTER — Ambulatory Visit: Payer: Medicaid Other | Admitting: Family Medicine

## 2015-02-28 ENCOUNTER — Ambulatory Visit: Payer: Self-pay | Admitting: Primary Care

## 2015-09-11 ENCOUNTER — Ambulatory Visit (INDEPENDENT_AMBULATORY_CARE_PROVIDER_SITE_OTHER): Payer: BLUE CROSS/BLUE SHIELD | Admitting: Family Medicine

## 2015-09-11 ENCOUNTER — Telehealth: Payer: Self-pay

## 2015-09-11 ENCOUNTER — Encounter: Payer: Self-pay | Admitting: Family Medicine

## 2015-09-11 VITALS — BP 136/82 | HR 81 | Temp 97.8°F | Wt 206.0 lb

## 2015-09-11 DIAGNOSIS — R5383 Other fatigue: Secondary | ICD-10-CM | POA: Diagnosis not present

## 2015-09-11 LAB — COMPREHENSIVE METABOLIC PANEL
ALBUMIN: 4.1 g/dL (ref 3.5–5.2)
ALK PHOS: 63 U/L (ref 39–117)
ALT: 16 U/L (ref 0–35)
AST: 14 U/L (ref 0–37)
BUN: 17 mg/dL (ref 6–23)
CHLORIDE: 106 meq/L (ref 96–112)
CO2: 31 mEq/L (ref 19–32)
CREATININE: 0.89 mg/dL (ref 0.40–1.20)
Calcium: 9.5 mg/dL (ref 8.4–10.5)
GFR: 82.48 mL/min (ref >60.00)
GLUCOSE: 70 mg/dL (ref 70–99)
Potassium: 4.3 mEq/L (ref 3.5–5.1)
SODIUM: 141 meq/L (ref 135–145)
TOTAL PROTEIN: 6.9 g/dL (ref 6.0–8.3)
Total Bilirubin: 0.2 mg/dL (ref 0.2–1.2)

## 2015-09-11 LAB — H. PYLORI ANTIBODY, IGG: H PYLORI IGG: NEGATIVE

## 2015-09-11 LAB — VITAMIN B12: Vitamin B-12: 213 pg/mL (ref 211–911)

## 2015-09-11 LAB — VITAMIN D 25 HYDROXY (VIT D DEFICIENCY, FRACTURES): VITD: 29.06 ng/mL — AB (ref 30.00–100.00)

## 2015-09-11 LAB — TSH: TSH: 4.86 u[IU]/mL — ABNORMAL HIGH (ref 0.35–4.50)

## 2015-09-11 NOTE — Telephone Encounter (Signed)
PLEASE NOTE: All timestamps contained within this report are represented as [place] Standard Time. CONFIDENTIALTY NOTICE: This fax transmission is intended only for the addressee. It contains information that is legally privileged, confidential or otherwise protected from use or disclosure. If you are not the intended recipient, you are strictly prohibited from reviewing, disclosing, copying using or disseminating any of this information or taking any action in reliance on or regarding this information. If you have received this fax in error, please notify us immediately by telephone so that we can arrange for its return to us. Phone: [phone], Toll-Free: [phone], Fax: [phone] Page: 1 of 1 Call Id: [id number] [facility] Nonclinical Telephone Record [facility] Contact Type Call Who Is Calling Patient / Member / Family / Caregiver Caller Name Jenelle MagesMary Caller Phone Number [phone] Patient Name Norma Taylor January 14, 1991 Call Type Message Only Information Provided Reason for Call Request to Schedule Office Appointment Initial Comment Caller is wanting to check the time of her daughter's appointment today. Call Closed By: Angelique BlonderAmy Wood Transaction Date/Time: 09/11/2015 8:03:36 AM (ET)

## 2015-09-11 NOTE — Progress Notes (Signed)
Pre visit review using our clinic review tool, if applicable. No additional management support is needed unless otherwise documented below in the visit note. 

## 2015-09-11 NOTE — Patient Instructions (Signed)
Great to see you. I will call you with your lab results from today .   

## 2015-09-11 NOTE — Assessment & Plan Note (Signed)
New without other symptoms per pt other than weight gain, which is not unexpected given diet and lifestyle. Likely multifactorial. Blood work today as part of initial work up. The patient indicates understanding of these issues and agrees with the plan. Orders Placed This Encounter  Procedures  . Comprehensive metabolic panel  . CBC with Differential/Platelet  . TSH  . Vitamin B12  . Vitamin D, 25-hydroxy  . H. pylori antibody, IgG

## 2015-09-11 NOTE — Progress Notes (Signed)
Subjective:   Patient ID: Norma Taylor, female    DOB: 1990/10/01, 25 y.o.   MRN: [id number]  TIONA RUANE is a pleasant 25 y.o. year old female who presents to clinic today with Fatigue  on 09/11/2015  HPI:  6 months of persistent fatigue.  Works 3rd shift but this is not new for her.  Now sleeping 12 hours when she gets home.  Denies CP or SOB.  No blood in her stool.  Denies feeling depressed or anxious.  Has gained weight but admits to not eating well.  No other potential symptoms of hypothyroidism.  Wt Readings from Last 3 Encounters:  09/11/15 206 lb (93.441 kg)  02/14/14 196 lb 4 oz (89.018 kg)  11/09/13 191 lb (86.637 kg)     No results found for: WBC, HGB, HCT, MCV, PLT Lab Results  Component Value Date   NA 143 06/06/2009   K 4.0 06/06/2009   CL 105 06/06/2009   CO2 31 06/06/2009   No results found for: TSH  No current outpatient prescriptions on file prior to visit.   No current facility-administered medications on file prior to visit.    No Known Allergies  Past Medical History  Diagnosis Date  . Allergy   . ADHD (attention deficit hyperactivity disorder)     No past surgical history on file.  Family History  Problem Relation Age of Onset  . Cancer Maternal Grandmother     cervical    Social History   Social History  . Marital Status: Single    Spouse Name: N/A  . Number of Children: N/A  . Years of Education: N/A   Occupational History  . Not on file.   Social History Main Topics  . Smoking status: Never Smoker   . Smokeless tobacco: Not on file  . Alcohol Use: No  . Drug Use: No  . Sexual Activity: Not on file   Other Topics Concern  . Not on file   Social History Narrative   Wants to join marines   Not sexual active, good relationship with mother   The PMH, PSH, Social History, Family History, Medications, and allergies have been reviewed in [facility], and have been updated if relevant.   Review of Systems    Constitutional: Positive for fatigue. Negative for fever and unexpected weight change.  HENT: Negative.   Eyes: Negative.   Respiratory: Negative.   Cardiovascular: Negative.   Gastrointestinal: Negative.   Endocrine: Negative.   Genitourinary: Negative.   Musculoskeletal: Negative.   Allergic/Immunologic: Negative.   Neurological: Negative.   Hematological: Negative.   Psychiatric/Behavioral: Negative.   All other systems reviewed and are negative.      Objective:    BP 136/82 mmHg  Pulse 81  Temp(Src) 97.8 F (36.6 C) (Oral)  Wt 206 lb (93.441 kg)  SpO2 98%  LMP 08/22/2015   Physical Exam  Constitutional: She is oriented to person, place, and time. She appears well-developed and well-nourished. No distress.  HENT:  Head: Normocephalic and atraumatic.  Eyes: Conjunctivae are normal.  Neck: Normal range of motion. Neck supple. No thyromegaly present.  Cardiovascular: Normal rate and regular rhythm.   Pulmonary/Chest: Effort normal and breath sounds normal.  Musculoskeletal: Normal range of motion.  Lymphadenopathy:    She has no cervical adenopathy.  Neurological: She is alert and oriented to person, place, and time. No cranial nerve deficit. Coordination normal.  Skin: Skin is warm and dry. She is not diaphoretic.  Psychiatric: She  has a normal mood and affect. Her behavior is normal. Judgment and thought content normal.  Nursing note and vitals reviewed.         Assessment & Plan:   Other fatigue No Follow-up on file.

## 2015-09-11 NOTE — Telephone Encounter (Signed)
[facility] but spoke with pt and advised time of appt 09/11/15 at 11:30 with Dr Dayton MartesAron. Pt voiced understanding.

## 2015-09-12 LAB — CBC WITH DIFFERENTIAL/PLATELET
BASOS PCT: 0.4 % (ref 0.0–3.0)
Basophils Absolute: 0.1 10*3/uL (ref 0.0–0.1)
EOS ABS: 0.4 10*3/uL (ref 0.0–0.7)
Eosinophils Relative: 3.4 % (ref 0.0–5.0)
HCT: 39.6 % (ref 36.0–46.0)
HEMOGLOBIN: 13 g/dL (ref 12.0–15.0)
LYMPHS ABS: 3.6 10*3/uL (ref 0.7–4.0)
Lymphocytes Relative: 29.8 % (ref 12.0–46.0)
MCHC: 32.8 g/dL (ref 30.0–36.0)
MCV: 79.2 fl (ref 78.0–100.0)
MONO ABS: 0.5 10*3/uL (ref 0.1–1.0)
Monocytes Relative: 4.5 % (ref 3.0–12.0)
NEUTROS PCT: 61.9 % (ref 43.0–77.0)
Neutro Abs: 7.6 10*3/uL (ref 1.4–7.7)
Platelets: 277 10*3/uL (ref 150.0–400.0)
RBC: 4.99 Mil/uL (ref 3.87–5.11)
RDW: 15.2 % (ref 11.5–15.5)
WBC: 12.2 10*3/uL — AB (ref 4.0–10.5)

## 2015-09-14 ENCOUNTER — Encounter: Payer: Self-pay | Admitting: *Deleted

## 2015-10-09 ENCOUNTER — Encounter: Payer: BLUE CROSS/BLUE SHIELD | Admitting: Family Medicine

## 2015-10-24 ENCOUNTER — Encounter: Payer: Self-pay | Admitting: Family Medicine

## 2015-10-24 ENCOUNTER — Ambulatory Visit (INDEPENDENT_AMBULATORY_CARE_PROVIDER_SITE_OTHER): Payer: BLUE CROSS/BLUE SHIELD | Admitting: Family Medicine

## 2015-10-24 ENCOUNTER — Other Ambulatory Visit (HOSPITAL_COMMUNITY)
Admission: RE | Admit: 2015-10-24 | Discharge: 2015-10-24 | Disposition: A | Discharge status: Home / Self Care | Payer: BLUE CROSS/BLUE SHIELD | Source: Ambulatory Visit | Attending: Family Medicine | Admitting: Family Medicine

## 2015-10-24 VITALS — BP 120/70 | HR 69 | Temp 97.9°F | Ht 65.25 in | Wt 203.5 lb

## 2015-10-24 DIAGNOSIS — Z01419 Encounter for gynecological examination (general) (routine) without abnormal findings: Secondary | ICD-10-CM

## 2015-10-24 DIAGNOSIS — Z0000 Encounter for general adult medical examination without abnormal findings: Secondary | ICD-10-CM

## 2015-10-24 DIAGNOSIS — Z113 Encounter for screening for infections with a predominantly sexual mode of transmission: Secondary | ICD-10-CM | POA: Diagnosis present

## 2015-10-24 DIAGNOSIS — N760 Acute vaginitis: Secondary | ICD-10-CM | POA: Diagnosis present

## 2015-10-24 DIAGNOSIS — R946 Abnormal results of thyroid function studies: Secondary | ICD-10-CM

## 2015-10-24 DIAGNOSIS — Z1151 Encounter for screening for human papillomavirus (HPV): Secondary | ICD-10-CM | POA: Insufficient documentation

## 2015-10-24 DIAGNOSIS — Z23 Encounter for immunization: Secondary | ICD-10-CM | POA: Diagnosis not present

## 2015-10-24 DIAGNOSIS — R7989 Other specified abnormal findings of blood chemistry: Secondary | ICD-10-CM

## 2015-10-24 LAB — T3, FREE: T3 FREE: 3.3 pg/mL (ref 2.3–4.2)

## 2015-10-24 LAB — TSH: TSH: 3.47 u[IU]/mL (ref 0.35–4.50)

## 2015-10-24 LAB — T4, FREE: Free T4: 0.8 ng/dL (ref 0.60–1.60)

## 2015-10-24 NOTE — Assessment & Plan Note (Signed)
Thyroid panel today

## 2015-10-24 NOTE — Progress Notes (Signed)
Subjective:   Patient ID: Norma Taylor, female    DOB: Oct 24, 1990, 24 y.o.   MRN: [id number]  Norma Taylor is a pleasant 25 y.o. year old female who presents to clinic today with Annual Exam (with pap)  on 10/24/2015  HPI:  Has never had pap smear before.  Denies any family history of cervical CA.  TSH elevated-  Has been more fatigued. Lab Results  Component Value Date   TSH 4.86 (H) 09/11/2015   No current outpatient prescriptions on file prior to visit.   No current facility-administered medications on file prior to visit.     No Known Allergies  Past Medical History:  Diagnosis Date  . ADHD (attention deficit hyperactivity disorder)   . Allergy     No past surgical history on file.  Family History  Problem Relation Age of Onset  . Cancer Maternal Grandmother     cervical    Social History   Social History  . Marital status: Single    Spouse name: N/A  . Number of children: N/A  . Years of education: N/A   Occupational History  . Not on file.   Social History Main Topics  . Smoking status: Never Smoker  . Smokeless tobacco: Not on file  . Alcohol use No  . Drug use: No  . Sexual activity: Not on file   Other Topics Concern  . Not on file   Social History Narrative   Wants to join marines   Not sexual active, good relationship with mother   The PMH, PSH, Social History, Family History, Medications, and allergies [facility], and have been updated if relevant.   Review of Systems  Constitutional: Positive for fatigue.  HENT: Negative.   Respiratory: Negative.   Cardiovascular: Negative.   Gastrointestinal: Negative.   Endocrine: Negative.   Genitourinary: Negative.   Musculoskeletal: Negative.   Allergic/Immunologic: Negative.   Neurological: Negative.   Hematological: Negative.   Psychiatric/Behavioral: Negative.   All other systems reviewed and are negative.      Objective:    BP 120/70   Pulse 69    Temp 97.9 F (36.6 C) (Oral)   Ht 5' 5.25" (1.657 m)   Wt 203 lb 8 oz (92.3 kg)   SpO2 97%   BMI 33.61 kg/m    Physical Exam    General:  Well-developed,well-nourished,in no acute distress; alert,appropriate and cooperative throughout examination Head:  normocephalic and atraumatic.   Eyes:  vision grossly intact, pupils equal, pupils round, and pupils reactive to light.   Ears:  R ear normal and L ear normal.   Nose:  no external deformity.   Mouth:  good dentition.   Neck:  No deformities, masses, or tenderness noted. Breasts:  Left breast larger than right breast (no change) Lungs:  Normal respiratory effort, chest expands symmetrically. Lungs are clear to auscultation, no crackles or wheezes. Heart:  Normal rate and regular rhythm. S1 and S2 normal without gallop, murmur, click, rub or other extra sounds. Abdomen:  Bowel sounds positive,abdomen soft and non-tender without masses, organomegaly or hernias noted. Rectal:  no external abnormalities.   Genitalia:  Pelvic Exam:        External: normal female genitalia without lesions or masses        Vagina: normal without lesions or masses        Cervix: normal without lesions or masses        Adnexa: normal bimanual exam without masses  or fullness        Uterus: normal by palpation        Pap smear: performed Msk:  No deformity or scoliosis noted of thoracic or lumbar spine.   Extremities:  No clubbing, cyanosis, edema, or deformity noted with normal full range of motion of all joints.   Neurologic:  alert & oriented X3 and gait normal.   Skin:  Intact without suspicious lesions or rashes Cervical Nodes:  No lymphadenopathy noted Axillary Nodes:  No palpable lymphadenopathy Psych:  Cognition and judgment appear intact. Alert and cooperative with normal attention span and concentration. No apparent delusions, illusions, hallucinations      Assessment & Plan:   Need for influenza vaccination - Plan: Flu Vaccine QUAD 36+ mos  PF IM (Fluarix & Fluzone Quad PF)  Well woman exam No Follow-up on file.

## 2015-10-24 NOTE — Assessment & Plan Note (Signed)
Reviewed preventive care protocols, scheduled due services, and updated immunizations Discussed nutrition, exercise, diet, and healthy lifestyle.  Pap smear today.  Flu vaccine given today.

## 2015-10-24 NOTE — Progress Notes (Signed)
Pre visit review using our clinic review tool, if applicable. No additional management support is needed unless otherwise documented below in the visit note. 

## 2015-10-24 NOTE — Patient Instructions (Signed)
Great to see you.  We will call you with your results. 

## 2015-10-25 ENCOUNTER — Encounter: Payer: Self-pay | Admitting: *Deleted

## 2015-10-25 LAB — CYTOLOGY - PAP

## 2015-10-26 ENCOUNTER — Encounter: Payer: Self-pay | Admitting: *Deleted

## 2015-10-28 LAB — CERVICOVAGINAL ANCILLARY ONLY
BACTERIAL VAGINITIS: POSITIVE — AB
CANDIDA VAGINITIS: NEGATIVE

## 2015-10-30 LAB — CERVICOVAGINAL ANCILLARY ONLY: Herpes: NEGATIVE

## 2015-11-06 MED ORDER — FLUCONAZOLE 150 MG PO TABS: 150.0000 mg | ORAL_TABLET | Freq: Once | ORAL | 2 refills | Status: AC

## 2015-11-06 MED ORDER — METRONIDAZOLE 500 MG PO TABS: 500.0000 mg | ORAL_TABLET | Freq: Two times a day (BID) | ORAL | 0 refills | Status: DC

## 2015-11-06 NOTE — Addendum Note (Signed)
Addended by: Desmond DikeKNIGHT, Kia Stavros H on: 11/06/2015 03:53 PM   Modules accepted: Orders

## 2015-11-09 ENCOUNTER — Encounter: Payer: Self-pay | Admitting: Primary Care

## 2015-11-09 ENCOUNTER — Ambulatory Visit (INDEPENDENT_AMBULATORY_CARE_PROVIDER_SITE_OTHER): Payer: BLUE CROSS/BLUE SHIELD | Admitting: Primary Care

## 2015-11-09 VITALS — BP 124/82 | HR 82 | Temp 98.0°F | Ht 65.25 in | Wt 205.4 lb

## 2015-11-09 DIAGNOSIS — J069 Acute upper respiratory infection, unspecified: Secondary | ICD-10-CM

## 2015-11-09 MED ORDER — HYDROCOD POLST-CPM POLST ER 10-8 MG/5ML PO SUER: 5.0000 mL | Freq: Two times a day (BID) | ORAL | 0 refills | Status: AC | PRN

## 2015-11-09 MED ORDER — ALBUTEROL SULFATE HFA 108 (90 BASE) MCG/ACT IN AERS
2.0000 | INHALATION_SPRAY | Freq: Four times a day (QID) | RESPIRATORY_TRACT | 0 refills | Status: AC | PRN
Start: 2015-11-09 — End: ?

## 2015-11-09 NOTE — Progress Notes (Signed)
Pre visit review using our clinic review tool, if applicable. No additional management support is needed unless otherwise documented below in the visit note. 

## 2015-11-09 NOTE — Progress Notes (Signed)
   Subjective:    Patient ID: Norma Taylor, female    DOB: Feb 22, 1991, 25 y.o.   MRN: [id number]  HPI  Ms. Norma Taylor is a 25 year old female with a history of allergic rhinitis who presents today with a chief complaint of nasal congestion. She also reports cough, fevers, some shortness of breath. Her symptoms have been present since Monday/Tuesday. Her temperature was 101.6 on Wednesday. Her cough is mostly non productive. She's been taking ibuprofen, Mucinex, and Delsym. Her cough is worse during the night. Denies chest pain, ear pain, sick contacts.  Review of Systems  Constitutional: Positive for fever. Negative for chills.  HENT: Positive for congestion, sinus pressure and sore throat. Negative for ear pain.   Respiratory: Positive for cough. Negative for shortness of breath and wheezing.   Cardiovascular: Negative for chest pain.       Past Medical History:  Diagnosis Date  . ADHD (attention deficit hyperactivity disorder)   . Allergy      Social History   Social History  . Marital status: Single    Spouse name: N/A  . Number of children: N/A  . Years of education: N/A   Occupational History  . Not on file.   Social History Main Topics  . Smoking status: Never Smoker  . Smokeless tobacco: Not on file  . Alcohol use No  . Drug use: No  . Sexual activity: Not on file   Other Topics Concern  . Not on file   Social History Narrative   Wants to join marines   Not sexual active, good relationship with mother    No past surgical history on file.  Family History  Problem Relation Age of Onset  . Cancer Maternal Grandmother     cervical    No Known Allergies  No current outpatient prescriptions on file prior to visit.   No current facility-administered medications on file prior to visit.     BP 124/82   Pulse 82   Temp 98 F (36.7 C) (Oral)   Ht 5' 5.25" (1.657 m)   Wt 205 lb 6.4 oz (93.2 kg)   LMP 10/21/2015   SpO2 98%   BMI 33.92 kg/m     Objective:   Physical Exam  Constitutional: She appears well-nourished.  HENT:  Right Ear: Tympanic membrane and ear canal normal.  Left Ear: Tympanic membrane and ear canal normal.  Nose: Mucosal edema present. Right sinus exhibits no maxillary sinus tenderness and no frontal sinus tenderness. Left sinus exhibits no maxillary sinus tenderness and no frontal sinus tenderness.  Mouth/Throat: Oropharynx is clear and moist.  Eyes: Conjunctivae are normal.  Neck: Neck supple.  Cardiovascular: Normal rate and regular rhythm.   Pulmonary/Chest: Effort normal. She has wheezes in the right upper field. She has rhonchi in the right lower field. She has no rales.  Lymphadenopathy:    She has no cervical adenopathy.  Skin: Skin is warm and dry.          Assessment & Plan:  URI:  Cough, congestion, fever x 3-4 days. Overall feeling better with Mucinex and ibuprofen. No improvement with Delsym. Exam today with mild rhonchi/congestion to right lower lobe, mild wheezing to right upper lobe. Vitals stable. Appears ill but not toxic. Suspect viral involvement based off of presentation and the fact that she's feeling improved. Rx for Tussionex cough syrup and albuterol inhaler provided. Continue Mucinex. Push fluids, rest, return precautions provided.  Morrie Sheldonlark,Katherine Kendal, NP

## 2015-11-09 NOTE — Patient Instructions (Signed)
Your symptoms are related to a viral infection that will clear on its own.  You may take the Tussionex cough suppressant every 12 hours as needed for cough and rest. Caution this medication contains codeine and will make you feel drowsy.  Continue taking Mucinex, ensure you take this with a full glass of water.  Nasal Congestion: Try using Flonase (fluticasone) nasal spray. Instill 2 sprays in each nostril once daily.   Please notify me if you develop persistent fevers of 101, start coughing up green mucous, notice increased fatigue or weakness, or feel worse after 1 week of onset of symptoms.   Increase consumption of water intake and rest.  It was a pleasure meeting you!   Upper Respiratory Infection, Adult Most upper respiratory infections (URIs) are a viral infection of the air passages leading to the lungs. A URI affects the nose, throat, and upper air passages. The most common type of URI is nasopharyngitis and is typically referred to as "the common cold." URIs run their course and usually go away on their own. Most of the time, a URI does not require medical attention, but sometimes a bacterial infection in the upper airways can follow a viral infection. This is called a secondary infection. Sinus and middle ear infections are common types of secondary upper respiratory infections. Bacterial pneumonia can also complicate a URI. A URI can worsen asthma and chronic obstructive pulmonary disease (COPD). Sometimes, these complications can require emergency medical care and may be life threatening.  CAUSES Almost all URIs are caused by viruses. A virus is a type of germ and can spread from one person to another.  RISKS FACTORS You may be at risk for a URI if:   You smoke.   You have chronic heart or lung disease.  You have a weakened defense (immune) system.   You are very young or very old.   You have nasal allergies or asthma.  You work in crowded or poorly ventilated  areas.  You work in health care facilities or schools. SIGNS AND SYMPTOMS  Symptoms typically develop 2-3 days after you come in contact with a cold virus. Most viral URIs last 7-10 days. However, viral URIs from the influenza virus (flu virus) can last 14-18 days and are typically more severe. Symptoms may include:   Runny or stuffy (congested) nose.   Sneezing.   Cough.   Sore throat.   Headache.   Fatigue.   Fever.   Loss of appetite.   Pain in your forehead, behind your eyes, and over your cheekbones (sinus pain).  Muscle aches.  DIAGNOSIS  Your health care provider may diagnose a URI by:  Physical exam.  Tests to check that your symptoms are not due to another condition such as:  Strep throat.  Sinusitis.  Pneumonia.  Asthma. TREATMENT  A URI goes away on its own with time. It cannot be cured with medicines, but medicines may be prescribed or recommended to relieve symptoms. Medicines may help:  Reduce your fever.  Reduce your cough.  Relieve nasal congestion. HOME CARE INSTRUCTIONS   Take medicines only as directed by your health care provider.   Gargle warm saltwater or take cough drops to comfort your throat as directed by your health care provider.  Use a warm mist humidifier or inhale steam from a shower to increase air moisture. This may make it easier to breathe.  Drink enough fluid to keep your urine clear or pale yellow.   Eat soups and  other clear broths and maintain good nutrition.   Rest as needed.   Return to work when your temperature has returned to normal or as your health care provider advises. You may need to stay home longer to avoid infecting others. You can also use a face mask and careful hand washing to prevent spread of the virus.  Increase the usage of your inhaler if you have asthma.   Do not use any tobacco products, including cigarettes, chewing tobacco, or electronic cigarettes. If you need help quitting,  ask your health care provider. PREVENTION  The best way to protect yourself from getting a cold is to practice good hygiene.   Avoid oral or hand contact with people with cold symptoms.   Wash your hands often if contact occurs.  There is no clear evidence that vitamin C, vitamin E, echinacea, or exercise reduces the chance of developing a cold. However, it is always recommended to get plenty of rest, exercise, and practice good nutrition.  SEEK MEDICAL CARE IF:   You are getting worse rather than better.   Your symptoms are not controlled by medicine.   You have chills.  You have worsening shortness of breath.  You have brown or red mucus.  You have yellow or brown nasal discharge.  You have pain in your face, especially when you bend forward.  You have a fever.  You have swollen neck glands.  You have pain while swallowing.  You have white areas in the back of your throat. SEEK IMMEDIATE MEDICAL CARE IF:   You have severe or persistent:  Headache.  Ear pain.  Sinus pain.  Chest pain.  You have chronic lung disease and any of the following:  Wheezing.  Prolonged cough.  Coughing up blood.  A change in your usual mucus.  You have a stiff neck.  You have changes in your:  Vision.  Hearing.  Thinking.  Mood. MAKE SURE YOU:   Understand these instructions.  Will watch your condition.  Will get help right away if you are not doing well or get worse.   This information is not intended to replace advice given to you by your health care provider. Make sure you discuss any questions you have with your health care provider.   Document Released: 08/06/2000 Document Revised: 06/27/2014 Document Reviewed: 05/18/2013 Elsevier Interactive Patient Education [employer]

## 2019-03-01 ENCOUNTER — Ambulatory Visit: Payer: BLUE CROSS/BLUE SHIELD | Attending: Internal Medicine

## 2019-08-17 ENCOUNTER — Telehealth: Payer: Self-pay | Admitting: General Practice

## 2019-08-17 NOTE — Telephone Encounter (Signed)
Removed Dr. Aron as patient's PCP due to her leaving this practice and patient has not been seen by her in 4 years.
